# Patient Record
Sex: Male | Born: 1965 | Race: White | Hispanic: No | Marital: Single
Health system: Southern US, Community
[De-identification: ages and names within clinical notes are randomized; demographics above are authoritative.]

---

## 2009-01-02 ENCOUNTER — Emergency Department (HOSPITAL_COMMUNITY): Admission: EM | Admit: 2009-01-02 | Discharge: 2009-01-03 | Payer: Self-pay | Admitting: Emergency Medicine

## 2010-08-07 IMAGING — CR DG CHEST 1V PORT
1 series · 1 of 1 positions shown · non-contrast
Comparison: None

CLINICAL DATA: Chest pain and tightness after spider bite.

PORTABLE CHEST - 1 VIEW

[view not recorded]
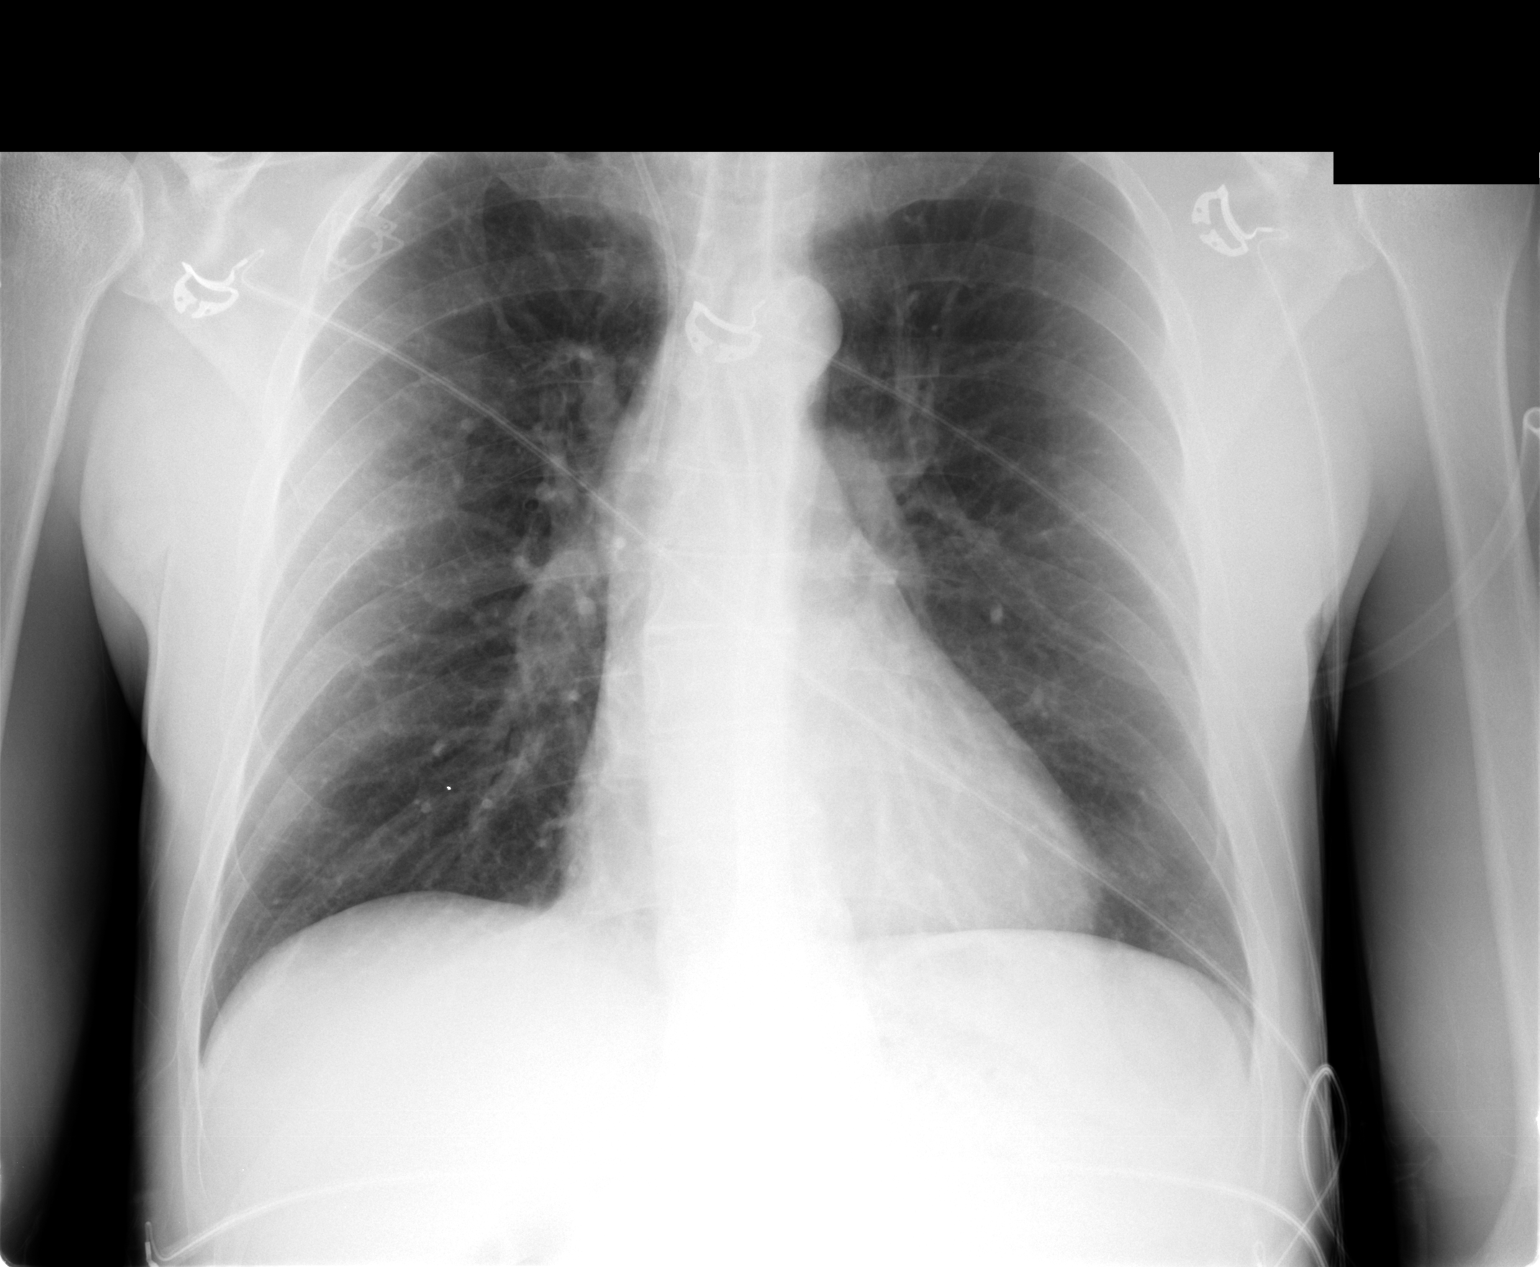

[1 of 1 positions shown; findings below may reference images not displayed]

FINDINGS: Right-sided Port-A-Cath is in appropriate position.  Both
lungs are clear.  Heart size is upper limits of normal.  There is
no evidence of pleural effusion.
IMPRESSION: No active disease.

## 2010-12-13 LAB — URINALYSIS, ROUTINE W REFLEX MICROSCOPIC
Glucose, UA: NEGATIVE mg/dL
Leukocytes, UA: NEGATIVE
Protein, ur: NEGATIVE mg/dL
Specific Gravity, Urine: 1.01 (ref 1.005–1.030)
Urobilinogen, UA: 0.2 mg/dL (ref 0.0–1.0)

## 2010-12-13 LAB — URINE MICROSCOPIC-ADD ON

## 2010-12-13 LAB — POCT CARDIAC MARKERS: CKMB, poc: 1.3 ng/mL (ref 1.0–8.0)

## 2010-12-13 LAB — RAPID URINE DRUG SCREEN, HOSP PERFORMED
Barbiturates: NOT DETECTED
Benzodiazepines: NOT DETECTED
Cocaine: NOT DETECTED

## 2010-12-13 LAB — BASIC METABOLIC PANEL
CO2: 28 mEq/L (ref 19–32)
Chloride: 102 mEq/L (ref 96–112)
GFR calc Af Amer: >60 mL/min (ref >60)
Potassium: 3.7 mEq/L (ref 3.5–5.1)
Sodium: 139 mEq/L (ref 135–145)

## 2010-12-13 LAB — CBC
HCT: 38.3 % — ABNORMAL LOW (ref 39.0–52.0)
Hemoglobin: 13.2 g/dL (ref 13.0–17.0)
MCHC: 34.4 g/dL (ref 30.0–36.0)
MCV: 88.2 fL (ref 78.0–100.0)
RBC: 4.34 MIL/uL (ref 4.22–5.81)
WBC: 6.5 10*3/uL (ref 4.0–10.5)

## 2010-12-13 LAB — DIFFERENTIAL
Basophils Relative: 0 % (ref 0–1)
Eosinophils Absolute: 0.1 10*3/uL (ref 0.0–0.7)
Lymphs Abs: 1.6 10*3/uL (ref 0.7–4.0)
Monocytes Absolute: 0.5 10*3/uL (ref 0.1–1.0)
Monocytes Relative: 8 % (ref 3–12)

## 2019-11-21 ENCOUNTER — Ambulatory Visit: Payer: Self-pay | Attending: Internal Medicine

## 2019-11-21 DIAGNOSIS — Z23 Encounter for immunization: Secondary | ICD-10-CM

## 2019-11-21 NOTE — Progress Notes (Signed)
   [facility]ov[facility] Vaccination [facility]  Name:  Ross Proctor    MRN: [id number] DOB: Nov 09, 1965  11/21/2019  Ross Proctor was observed post Covid-19 immunization for 15 minutes without incident. He was provided with Vaccine Information Sheet and instruction to access the V-Safe system.   Ross Proctor was instructed to call 911 with any severe reactions post vaccine: Marland Kitchen Difficulty breathing  . Swelling of face and throat  . A fast heartbeat  . A bad rash all over body  . Dizziness and weakness   Immunizations Administered    Name Date Dose VIS Date Route   Pfizer COVID-19 Vaccine 11/21/2019  1:31 PM 0.3 mL 08/15/2019 Intramuscular   Manufacturer: [employer], [employer]   Lot: [id number]   NDC: [address]-1

## 2019-12-16 ENCOUNTER — Ambulatory Visit: Payer: Self-pay | Attending: Internal Medicine

## 2019-12-16 DIAGNOSIS — Z23 Encounter for immunization: Secondary | ICD-10-CM

## 2019-12-16 NOTE — Progress Notes (Signed)
   Covid-19 Vaccination Clinic  Name:  Ross Proctor    MRN: [id number] DOB: [birth year]  12/16/2019  [name] was observed post Covid-19 immunization for 15 minutes without incident. He was provided with Vaccine Information Sheet and instruction to access the V-Safe system.   [name] was instructed to call 911 with any severe reactions post vaccine: Marland Kitchen Difficulty breathing  . Swelling of face and throat  . A fast heartbeat  . A bad rash all over body  . Dizziness and weakness   Immunizations Administered    Name Date Dose VIS Date Route   Pfizer COVID-19 Vaccine 12/16/2019  2:13 PM 0.3 mL 08/15/2019 Intramuscular   Manufacturer: [employer], [employer]   Lot: [id number]   NDC: [address]-9
